# Patient Record
Sex: Male | Born: 1995 | Race: White | Hispanic: No | Marital: Single | Smoking: Never smoker
Health system: Southern US, Community
[De-identification: ages and names within clinical notes are randomized; demographics above are authoritative.]

## PROBLEM LIST (undated history)

## (undated) DIAGNOSIS — IMO0001 Reserved for inherently not codable concepts without codable children: Secondary | ICD-10-CM

## (undated) HISTORY — PX: WISDOM TOOTH EXTRACTION: SHX21

## (undated) HISTORY — DX: Reserved for inherently not codable concepts without codable children: IMO0001

---

## 2014-07-23 ENCOUNTER — Ambulatory Visit (INDEPENDENT_AMBULATORY_CARE_PROVIDER_SITE_OTHER): Payer: BLUE CROSS/BLUE SHIELD

## 2014-07-23 DIAGNOSIS — Z23 Encounter for immunization: Secondary | ICD-10-CM

## 2014-07-23 MED ORDER — HPV QUADRIVALENT VACCINE IM SUSP
0.5000 mL | Freq: Once | INTRAMUSCULAR | Status: AC
  Administered 2014-07-23: 0.5 mL via INTRAMUSCULAR

## 2014-12-08 ENCOUNTER — Encounter: Payer: BLUE CROSS/BLUE SHIELD | Admitting: Family Medicine

## 2014-12-10 ENCOUNTER — Ambulatory Visit (INDEPENDENT_AMBULATORY_CARE_PROVIDER_SITE_OTHER): Payer: BLUE CROSS/BLUE SHIELD | Admitting: Family Medicine

## 2014-12-10 ENCOUNTER — Encounter: Payer: Self-pay | Admitting: Family Medicine

## 2014-12-10 ENCOUNTER — Other Ambulatory Visit: Payer: Self-pay | Admitting: Family Medicine

## 2014-12-10 VITALS — BP 101/62 | HR 62 | Temp 97.0°F | Ht 70.25 in | Wt 131.6 lb

## 2014-12-10 DIAGNOSIS — R591 Generalized enlarged lymph nodes: Secondary | ICD-10-CM | POA: Insufficient documentation

## 2014-12-10 DIAGNOSIS — R599 Enlarged lymph nodes, unspecified: Secondary | ICD-10-CM | POA: Diagnosis not present

## 2014-12-10 DIAGNOSIS — R55 Syncope and collapse: Secondary | ICD-10-CM | POA: Diagnosis not present

## 2014-12-10 DIAGNOSIS — Z0000 Encounter for general adult medical examination without abnormal findings: Secondary | ICD-10-CM

## 2014-12-10 NOTE — Assessment & Plan Note (Signed)
Swollen. Will check labs and await results. Will pursue ultrasound over his winter break to r/o anything concerning given his 10lb weight loss over the last year.

## 2014-12-10 NOTE — Progress Notes (Signed)
BP 101/62 mmHg  Pulse 62  Temp(Src) 97 F (36.1 C)  Ht 5' 10.25" (1.784 m)  Wt 131 lb 9.6 oz (59.693 kg)  BMI 18.76 kg/m2  SpO2 96%   Subjective:    Patient ID: Ryan Bennett, male    DOB: 04/23/1995, 19 y.o.   MRN: [id number]  HPI: Ryan Bennett is a 19 y.o. male presenting on 12/10/2014 for comprehensive medical examination. Current medical complaints include:  Ryan Bennett passed out while having his routine labwork done today with the blood draw. Has a history of vasovagal events. Felt significantly better with laying down and oxygen and drinking some juice.   SKIN LESION Duration: over a year Location: right under the chin on the L side Painful: no Itching: no Onset: gradual Context: not changing Associated signs and symptoms:  History of skin cancer: no History of precancerous skin lesions: no Family history of skin cancer: no   No concerns about STIs, does not want to be screened at this time.   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 Healthy adult     Surgical History:  Past Surgical History  Procedure Laterality Date  . Wisdom tooth extraction      Medications:  No current outpatient prescriptions on file prior to visit.   No current facility-administered medications on file prior to visit.    Allergies:  Allergies  Allergen Reactions  . Cephalosporins Rash  . Penicillins Rash    Social History:  Social History   Social History  . Marital Status: Single    Spouse Name: N/A  . Number of Children: N/A  . Years of Education: N/A   Occupational History  . Student     [employer]- studying mechanical engineering   Social History Main  Topics  . Smoking status: Never Smoker   . Smokeless tobacco: Never Used  . Alcohol Use: No  . Drug Use: No  . Sexual Activity: Not Currently    Birth Control/ Protection: Condom   Other Topics Concern  . Not on file   Social History Narrative   History  Smoking status  . Never Smoker   Smokeless tobacco  . Never Used   History  Alcohol Use No    Family History:  Family History  Problem Relation Age of Onset  . ADD / ADHD Brother     Past medical history, surgical history, medications, allergies, family history and social history reviewed with patient today and changes made to appropriate areas of the chart.   Review of Systems  Constitutional: Positive for weight loss. Negative for fever, chills, malaise/fatigue and diaphoresis.  HENT: Negative.   Eyes: Negative.   Respiratory: Negative.   Cardiovascular: Negative.   Gastrointestinal: Negative.   Genitourinary: Negative.   Musculoskeletal: Negative.   Skin: Negative.   Neurological: Negative.  Negative for weakness.  Endo/Heme/Allergies: Negative.   Psychiatric/Behavioral: Negative.     All other ROS negative except what is listed above and in the HPI.      Objective:    BP 101/62 mmHg  Pulse 62  Temp(Src) 97 F (36.1 C)  Ht 5' 10.25" (1.784 m)  Wt 131 lb 9.6 oz (  59.693 kg)  BMI 18.76 kg/m2  SpO2 96%  Wt Readings from Last 3 Encounters:  12/10/14 131 lb 9.6 oz (59.693 kg)  12/05/14 141 lb (63.957 kg)    Physical Exam  Constitutional: He is oriented to person, place, and time. He appears well-developed and well-nourished. No distress.  HENT:  Head: Normocephalic and atraumatic.  Right Ear: Hearing, tympanic membrane, external ear and ear canal normal.  Left Ear: Hearing, tympanic membrane, external ear and ear canal normal.  Nose: Nose normal.  Mouth/Throat: Uvula is midline, oropharynx is clear and moist and mucous membranes are normal. No oropharyngeal exudate.  Eyes: Conjunctivae, EOM and lids  are normal. Pupils are equal, round, and reactive to light. Right eye exhibits no discharge. Left eye exhibits no discharge. No scleral icterus.  Neck: Normal range of motion. Neck supple. No JVD present. No tracheal deviation present. No thyromegaly present.    1.5cm soft, mobile swelling  Cardiovascular: Normal rate, regular rhythm, normal heart sounds and intact distal pulses.  Exam reveals no gallop and no friction rub.   No murmur heard. Pulmonary/Chest: Effort normal and breath sounds normal. No stridor. No respiratory distress. He has no wheezes. He has no rales. He exhibits no tenderness.  Abdominal: Soft. Bowel sounds are normal. He exhibits no distension and no mass. There is no tenderness. There is no rebound and no guarding.  Genitourinary: Penis normal. No penile tenderness.  Musculoskeletal: Normal range of motion. He exhibits no edema or tenderness.  Lymphadenopathy:    He has cervical adenopathy.  Neurological: He is alert and oriented to person, place, and time. He has normal reflexes. He displays normal reflexes. No cranial nerve deficit. He exhibits normal muscle tone. Coordination normal.  Skin: Skin is warm, dry and intact. No rash noted. He is not diaphoretic. No erythema. There is pallor.  Psychiatric: He has a normal mood and affect. His speech is normal and behavior is normal. Judgment and thought content normal. Cognition and memory are normal.  Nursing note and vitals reviewed.   No results found for this or any previous visit.    Assessment & Plan:   Problem List Items Addressed This Visit      Cardiovascular and Mediastinum   Vasovagal episode    Will eat more often. Will lay down when getting blood drawn. Information given.         Immune and Lymphatic   Lymphadenopathy of head and neck    Swollen. Will check labs and await results. Will pursue ultrasound over his winter break to r/o anything concerning given his 10lb weight loss over the last year.          Other Visit Diagnoses    Routine general medical examination at a health care facility    -  Primary    Up to date on his vaccines. Does not want STI screening. Checking screening labs today. Continue diet and exercise. Continue to monitor.        LABORATORY TESTING:  Health maintenance labs ordered today as discussed above.    IMMUNIZATIONS:   - Tdap: Tetanus vaccination status reviewed: last tetanus booster within 10 years. - Influenza: Given elsewhere  PATIENT COUNSELING:    Sexuality: Discussed sexually transmitted diseases, partner selection, use of condoms, avoidance of unintended pregnancy  and contraceptive alternatives.   Advised to avoid cigarette smoking.  I discussed with the patient that most people either abstain from alcohol or drink within safe limits (<=14/week and <=4 drinks/occasion for males, <=7/weeks  and <= 3 drinks/occasion for females) and that the risk for alcohol disorders and other health effects rises proportionally with the number of drinks per week and how often a drinker exceeds daily limits.  Discussed cessation/primary prevention of drug use and availability of treatment for abuse.   Diet: Encouraged to adjust caloric intake to maintain  or achieve ideal body weight, to reduce intake of dietary saturated fat and total fat, to limit sodium intake by avoiding high sodium foods and not adding table salt, and to maintain adequate dietary potassium and calcium preferably from fresh fruits, vegetables, and low-fat dairy products.    stressed the importance of regular exercise  Injury prevention: Discussed safety belts, safety helmets, smoke detector, smoking near bedding or upholstery.   Dental health: Discussed importance of regular tooth brushing, flossing, and dental visits.   Follow up plan: NEXT PREVENTATIVE PHYSICAL DUE IN 1 YEAR. Return in about 1 year (around 12/10/2015) for Physical, pending results.

## 2014-12-10 NOTE — Assessment & Plan Note (Addendum)
Will eat more often. Will lay down when getting blood drawn. Information given.

## 2014-12-10 NOTE — Patient Instructions (Addendum)
We'll wait for the blood work to come back. If there is anything abnormal, we'll let you know. If your blood work is normal, since the spot has been there for a year, we'll get an ultrasound to make sure it's nothing to worry about over your winter break.  Preventive Care for Adults, Male A healthy lifestyle and preventive care can promote health and wellness. Preventive health guidelines for men include the following key practices:  A routine yearly physical is a good way to check with your health care provider about your health and preventative screening. It is a chance to share any concerns and updates on your health and to receive a thorough exam.  Visit your dentist for a routine exam and preventative care every 6 months. Brush your teeth twice a day and floss once a day. Good oral hygiene prevents tooth decay and gum disease.  The frequency of eye exams is based on your age, health, family medical history, use of contact lenses, and other factors. Follow your health care provider's recommendations for frequency of eye exams.  Eat a healthy diet. Foods such as vegetables, fruits, whole grains, low-fat dairy products, and lean protein foods contain the nutrients you need without too many calories. Decrease your intake of foods high in solid fats, added sugars, and salt. Eat the right amount of calories for you.Get information about a proper diet from your health care provider, if necessary.  Regular physical exercise is one of the most important things you can do for your health. Most adults should get at least 150 minutes of moderate-intensity exercise (any activity that increases your heart rate and causes you to sweat) each week. In addition, most adults need muscle-strengthening exercises on 2 or more days a week.  Maintain a healthy weight. The body mass index (BMI) is a screening tool to identify possible weight problems. It provides an estimate of body fat based on height and weight. Your  health care provider can find your BMI and can help you achieve or maintain a healthy weight.For adults 20 years and older:  A BMI below 18.5 is considered underweight.  A BMI of 18.5 to 24.9 is normal.  A BMI of 25 to 29.9 is considered overweight.  A BMI of 30 and above is considered obese.  Maintain normal blood lipids and cholesterol levels by exercising and minimizing your intake of saturated fat. Eat a balanced diet with plenty of fruit and vegetables. Blood tests for lipids and cholesterol should begin at age 70 and be repeated every 5 years. If your lipid or cholesterol levels are high, you are over 50, or you are at high risk for heart disease, you may need your cholesterol levels checked more frequently.Ongoing high lipid and cholesterol levels should be treated with medicines if diet and exercise are not working.  If you smoke, find out from your health care provider how to quit. If you do not use tobacco, do not start.  Lung cancer screening is recommended for adults aged 44-80 years who are at high risk for developing lung cancer because of a history of smoking. A yearly low-dose CT scan of the lungs is recommended for people who have at least a 30-pack-year history of smoking and are a current smoker or have quit within the past 15 years. A pack year of smoking is smoking an average of 1 pack of cigarettes a day for 1 year (for example: 1 pack a day for 30 years or 2 packs a day  for 15 years). Yearly screening should continue until the smoker has stopped smoking for at least 15 years. Yearly screening should be stopped for people who develop a health problem that would prevent them from having lung cancer treatment.  If you choose to drink alcohol, do not have more than 2 drinks per day. One drink is considered to be 12 ounces (355 mL) of beer, 5 ounces (148 mL) of wine, or 1.5 ounces (44 mL) of liquor.  Avoid use of street drugs. Do not share needles with anyone. Ask for help if  you need support or instructions about stopping the use of drugs.  High blood pressure causes heart disease and increases the risk of stroke. Your blood pressure should be checked at least every 1-2 years. Ongoing high blood pressure should be treated with medicines, if weight loss and exercise are not effective.  If you are 18-62 years old, ask your health care provider if you should take aspirin to prevent heart disease.  Diabetes screening is done by taking a blood sample to check your blood glucose level after you have not eaten for a certain period of time (fasting). If you are not overweight and you do not have risk factors for diabetes, you should be screened once every 3 years starting at age 71. If you are overweight or obese and you are 17-43 years of age, you should be screened for diabetes every year as part of your cardiovascular risk assessment.  Colorectal cancer can be detected and often prevented. Most routine colorectal cancer screening begins at the age of 60 and continues through age 28. However, your health care provider may recommend screening at an earlier age if you have risk factors for colon cancer. On a yearly basis, your health care provider may provide home test kits to check for hidden blood in the stool. Use of a small camera at the end of a tube to directly examine the colon (sigmoidoscopy or colonoscopy) can detect the earliest forms of colorectal cancer. Talk to your health care provider about this at age 23, when routine screening begins. Direct exam of the colon should be repeated every 5-10 years through age 83, unless early forms of precancerous polyps or small growths are found.  People who are at an increased risk for hepatitis B should be screened for this virus. You are considered at high risk for hepatitis B if:  You were born in a country where hepatitis B occurs often. Talk with your health care provider about which countries are considered high risk.  Your  parents were born in a high-risk country and you have not received a shot to protect against hepatitis B (hepatitis B vaccine).  You have HIV or AIDS.  You use needles to inject street drugs.  You live with, or have sex with, someone who has hepatitis B.  You are a man who has sex with other men (MSM).  You get hemodialysis treatment.  You take certain medicines for conditions such as cancer, organ transplantation, and autoimmune conditions.  Hepatitis C blood testing is recommended for all people born from [birth year] through 1965 and any individual with known risks for hepatitis C.  Practice safe sex. Use condoms and avoid high-risk sexual practices to reduce the spread of sexually transmitted infections (STIs). STIs include gonorrhea, chlamydia, syphilis, trichomonas, herpes, HPV, and human immunodeficiency virus (HIV). Herpes, HIV, and HPV are viral illnesses that have no cure. They can result in disability, cancer, and death.  If you  are a man who has sex with other men, you should be screened at least once per year for:  HIV.  Urethral, rectal, and pharyngeal infection of gonorrhea, chlamydia, or both.  If you are at risk of being infected with HIV, it is recommended that you take a prescription medicine daily to prevent HIV infection. This is called preexposure prophylaxis (PrEP). You are considered at risk if:  You are a man who has sex with other men (MSM) and have other risk factors.  You are a heterosexual man, are sexually active, and are at increased risk for HIV infection.  You take drugs by injection.  You are sexually active with a partner who has HIV.  Talk with your health care provider about whether you are at high risk of being infected with HIV. If you choose to begin PrEP, you should first be tested for HIV. You should then be tested every 3 months for as long as you are taking PrEP.  A one-time screening for abdominal aortic aneurysm (AAA) and surgical repair of  large AAAs by ultrasound are recommended for men ages 17 to 39 years who are current or former smokers.  Healthy men should no longer receive prostate-specific antigen (PSA) blood tests as part of routine cancer screening. Talk with your health care provider about prostate cancer screening.  Testicular cancer screening is not recommended for adult males who have no symptoms. Screening includes self-exam, a health care provider exam, and other screening tests. Consult with your health care provider about any symptoms you have or any concerns you have about testicular cancer.  Use sunscreen. Apply sunscreen liberally and repeatedly throughout the day. You should seek shade when your shadow is shorter than you. Protect yourself by wearing long sleeves, pants, a wide-brimmed hat, and sunglasses year round, whenever you are outdoors.  Once a month, do a whole-body skin exam, using a mirror to look at the skin on your back. Tell your health care provider about new moles, moles that have irregular borders, moles that are larger than a pencil eraser, or moles that have changed in shape or color.  Stay current with required vaccines (immunizations).  Influenza vaccine. All adults should be immunized every year.  Tetanus, diphtheria, and acellular pertussis (Td, Tdap) vaccine. An adult who has not previously received Tdap or who does not know his vaccine status should receive 1 dose of Tdap. This initial dose should be followed by tetanus and diphtheria toxoids (Td) booster doses every 10 years. Adults with an unknown or incomplete history of completing a 3-dose immunization series with Td-containing vaccines should begin or complete a primary immunization series including a Tdap dose. Adults should receive a Td booster every 10 years.  Varicella vaccine. An adult without evidence of immunity to varicella should receive 2 doses or a second dose if he has previously received 1 dose.  Human papillomavirus  (HPV) vaccine. Males aged 11-21 years who have not received the vaccine previously should receive the 3-dose series. Males aged 22-26 years may be immunized. Immunization is recommended through the age of 79 years for any male who has sex with males and did not get any or all doses earlier. Immunization is recommended for any person with an immunocompromised condition through the age of 53 years if he did not get any or all doses earlier. During the 3-dose series, the second dose should be obtained 4-8 weeks after the first dose. The third dose should be obtained 24 weeks after the first  dose and 16 weeks after the second dose.  Zoster vaccine. One dose is recommended for adults aged 45 years or older unless certain conditions are present.  Measles, mumps, and rubella (MMR) vaccine. Adults born before [birth year] generally are considered immune to measles and mumps. Adults born in [birth year] or later should have 1 or more doses of MMR vaccine unless there is a contraindication to the vaccine or there is laboratory evidence of immunity to each of the three diseases. A routine second dose of MMR vaccine should be obtained at least 28 days after the first dose for students attending postsecondary schools, health care workers, or international travelers. People who received inactivated measles vaccine or an unknown type of measles vaccine during 1963-1967 should receive 2 doses of MMR vaccine. People who received inactivated mumps vaccine or an unknown type of mumps vaccine before 1979 and are at high risk for mumps infection should consider immunization with 2 doses of MMR vaccine. Unvaccinated health care workers born before [birth year] who lack laboratory evidence of measles, mumps, or rubella immunity or laboratory confirmation of disease should consider measles and mumps immunization with 2 doses of MMR vaccine or rubella immunization with 1 dose of MMR vaccine.  Pneumococcal 13-valent conjugate (PCV13) vaccine. When indicated,  a person who is uncertain of his immunization history and has no record of immunization should receive the PCV13 vaccine. All adults 52 years of age and older should receive this vaccine. An adult aged 60 years or older who has certain medical conditions and has not been previously immunized should receive 1 dose of PCV13 vaccine. This PCV13 should be followed with a dose of pneumococcal polysaccharide (PPSV23) vaccine. Adults who are at high risk for pneumococcal disease should obtain the PPSV23 vaccine at least 8 weeks after the dose of PCV13 vaccine. Adults older than 21 years of age who have normal immune system function should obtain the PPSV23 vaccine dose at least 1 year after the dose of PCV13 vaccine.  Pneumococcal polysaccharide (PPSV23) vaccine. When PCV13 is also indicated, PCV13 should be obtained first. All adults aged 25 years and older should be immunized. An adult younger than age 25 years who has certain medical conditions should be immunized. Any person who resides in a nursing home or long-term care facility should be immunized. An adult smoker should be immunized. People with an immunocompromised condition and certain other conditions should receive both PCV13 and PPSV23 vaccines. People with human immunodeficiency virus (HIV) infection should be immunized as soon as possible after diagnosis. Immunization during chemotherapy or radiation therapy should be avoided. Routine use of PPSV23 vaccine is not recommended for American Indians, [place] Natives, or people younger than 65 years unless there are medical conditions that require PPSV23 vaccine. When indicated, people who have unknown immunization and have no record of immunization should receive PPSV23 vaccine. One-time revaccination 5 years after the first dose of PPSV23 is recommended for people aged 19-64 years who have chronic kidney failure, nephrotic syndrome, asplenia, or immunocompromised conditions. People who received 1-2 doses of  PPSV23 before age 58 years should receive another dose of PPSV23 vaccine at age 32 years or later if at least 5 years have passed since the previous dose. Doses of PPSV23 are not needed for people immunized with PPSV23 at or after age 62 years.  Meningococcal vaccine. Adults with asplenia or persistent complement component deficiencies should receive 2 doses of quadrivalent meningococcal conjugate (MenACWY-D) vaccine. The doses should be obtained at least 2 months apart. Microbiologists  working with certain meningococcal bacteria, [place] recruits, people at risk during an outbreak, and people who travel to or live in countries with a high rate of meningitis should be immunized. A first-year college student up through age 16 years who is living in a residence hall should receive a dose if he did not receive a dose on or after his 16th birthday. Adults who have certain high-risk conditions should receive one or more doses of vaccine.  Hepatitis A vaccine. Adults who wish to be protected from this disease, have chronic liver disease, work with hepatitis A-infected animals, work in hepatitis A research labs, or travel to or work in countries with a high rate of hepatitis A should be immunized. Adults who were previously unvaccinated and who anticipate close contact with an international adoptee during the first 60 days after arrival in the [place] from a country with a high rate of hepatitis A should be immunized.  Hepatitis B vaccine. Adults should be immunized if they wish to be protected from this disease, are under age 23 years and have diabetes, have chronic liver disease, have had more than one sex partner in the past 6 months, may be exposed to blood or other infectious body fluids, are household contacts or sex partners of hepatitis B positive people, are clients or workers in certain care facilities, or travel to or work in countries with a high rate of hepatitis B.  Haemophilus influenzae type  b (Hib) vaccine. A previously unvaccinated person with asplenia or sickle cell disease or having a scheduled splenectomy should receive 1 dose of Hib vaccine. Regardless of previous immunization, a recipient of a hematopoietic stem cell transplant should receive a 3-dose series 6-12 months after his successful transplant. Hib vaccine is not recommended for adults with HIV infection. Preventive Service / Frequency Ages 29 to 19  Blood pressure check.** / Every 3-5 years.  Lipid and cholesterol check.** / Every 5 years beginning at age 95.  Hepatitis C blood test.** / For any individual with known risks for hepatitis C.  Skin self-exam. / Monthly.  Influenza vaccine. / Every year.  Tetanus, diphtheria, and acellular pertussis (Tdap, Td) vaccine.** / Consult your health care provider. 1 dose of Td every 10 years.  Varicella vaccine.** / Consult your health care provider.  HPV vaccine. / 3 doses over 6 months, if 41 or younger.  Measles, mumps, rubella (MMR) vaccine.** / You need at least 1 dose of MMR if you were born in 1957 or later. You may also need a second dose.  Pneumococcal 13-valent conjugate (PCV13) vaccine.** / Consult your health care provider.  Pneumococcal polysaccharide (PPSV23) vaccine.** / 1 to 2 doses if you smoke cigarettes or if you have certain conditions.  Meningococcal vaccine.** / 1 dose if you are age 33 to 23 years and a Market researcher living in a residence hall, or have one of several medical conditions. You may also need additional booster doses.  Hepatitis A vaccine.** / Consult your health care provider.  Hepatitis B vaccine.** / Consult your health care provider.  Haemophilus influenzae type b (Hib) vaccine.** / Consult your health care provider. Ages 109 to 37  Blood pressure check.** / Every year.  Lipid and cholesterol check.** / Every 5 years beginning at age 21.  Lung cancer screening. / Every year if you are aged 40-80 years and have  a 30-pack-year history of smoking and currently smoke or have quit within the past 15 years. Yearly screening is stopped  once you have quit smoking for at least 15 years or develop a health problem that would prevent you from having lung cancer treatment.  Fecal occult blood test (FOBT) of stool. / Every year beginning at age 77 and continuing until age 75. You may not have to do this test if you get a colonoscopy every 10 years.  Flexible sigmoidoscopy** or colonoscopy.** / Every 5 years for a flexible sigmoidoscopy or every 10 years for a colonoscopy beginning at age 35 and continuing until age 82.  Hepatitis C blood test.** / For all people born from [birth year] through 1965 and any individual with known risks for hepatitis C.  Skin self-exam. / Monthly.  Influenza vaccine. / Every year.  Tetanus, diphtheria, and acellular pertussis (Tdap/Td) vaccine.** / Consult your health care provider. 1 dose of Td every 10 years.  Varicella vaccine.** / Consult your health care provider.  Zoster vaccine.** / 1 dose for adults aged 81 years or older.  Measles, mumps, rubella (MMR) vaccine.** / You need at least 1 dose of MMR if you were born in 1959 or later. You may also need a second dose.  Pneumococcal 13-valent conjugate (PCV13) vaccine.** / Consult your health care provider.  Pneumococcal polysaccharide (PPSV23) vaccine.** / 1 to 2 doses if you smoke cigarettes or if you have certain conditions.  Meningococcal vaccine.** / Consult your health care provider.  Hepatitis A vaccine.** / Consult your health care provider.  Hepatitis B vaccine.** / Consult your health care provider.  Haemophilus influenzae type b (Hib) vaccine.** / Consult your health care provider. Ages 77 and over  Blood pressure check.** / Every year.  Lipid and cholesterol check.**/ Every 5 years beginning at age 58.  Lung cancer screening. / Every year if you are aged 65-80 years and have a 30-pack-year history of smoking and  currently smoke or have quit within the past 15 years. Yearly screening is stopped once you have quit smoking for at least 15 years or develop a health problem that would prevent you from having lung cancer treatment.  Fecal occult blood test (FOBT) of stool. / Every year beginning at age 27 and continuing until age 21. You may not have to do this test if you get a colonoscopy every 10 years.  Flexible sigmoidoscopy** or colonoscopy.** / Every 5 years for a flexible sigmoidoscopy or every 10 years for a colonoscopy beginning at age 66 and continuing until age 62.  Hepatitis C blood test.** / For all people born from [birth year] through 1967 and any individual with known risks for hepatitis C.  Abdominal aortic aneurysm (AAA) screening.** / A one-time screening for ages 22 to 33 years who are current or former smokers.  Skin self-exam. / Monthly.  Influenza vaccine. / Every year.  Tetanus, diphtheria, and acellular pertussis (Tdap/Td) vaccine.** / 1 dose of Td every 10 years.  Varicella vaccine.** / Consult your health care provider.  Zoster vaccine.** / 1 dose for adults aged 23 years or older.  Pneumococcal 13-valent conjugate (PCV13) vaccine.** / 1 dose for all adults aged 61 years and older.  Pneumococcal polysaccharide (PPSV23) vaccine.** / 1 dose for all adults aged 70 years and older.  Meningococcal vaccine.** / Consult your health care provider.  Hepatitis A vaccine.** / Consult your health care provider.  Hepatitis B vaccine.** / Consult your health care provider.  Haemophilus influenzae type b (Hib) vaccine.** / Consult your health care provider. **Family history and personal history of risk and conditions may change your health  care provider's recommendations.   This information is not intended to replace advice given to you by your health care provider. Make sure you discuss any questions you have with your health care provider.   Document Released: 03/01/2001 Document Revised:  01/24/2014 Document Reviewed: 05/31/2010 Elsevier Interactive Patient Education [employer]. Syncope, commonly known as fainting, is a temporary loss of consciousness. It occurs when the blood flow to the brain is reduced. Vasovagal syncope (also called neurocardiogenic syncope) is a fainting spell in which the blood flow to the brain is reduced because of a sudden drop in heart rate and blood pressure. Vasovagal syncope occurs when the brain and the cardiovascular system (blood vessels) do not adequately communicate and respond to each other. This is the most common cause of fainting. It often occurs in response to fear or some other type of emotional or physical stress. The body has a reaction in which the heart starts beating too slowly or the blood vessels expand, reducing blood pressure. This type of fainting spell is generally considered harmless. However, injuries can occur if a person takes a sudden fall during a fainting spell.  CAUSES  Vasovagal syncope occurs when a person's blood pressure and heart rate decrease suddenly, usually in response to a trigger. Many things and situations can trigger an episode. Some of these include:   Pain.   Fear.   The sight of blood or medical procedures, such as blood being drawn from a vein.   Common activities, such as coughing, swallowing, stretching, or going to the bathroom.   Emotional stress.   Prolonged standing, especially in a warm environment.   Lack of sleep or rest.   Prolonged lack of food.   Prolonged lack of fluids.   Recent illness.  The use of certain drugs that affect blood pressure, such as cocaine, alcohol, marijuana, inhalants, and opiates.  SYMPTOMS  Before the fainting episode, you may:   Feel dizzy or light headed.   Become pale.  Sense that you are going to faint.   Feel like the room is spinning.   Have tunnel vision, only seeing directly in front of you.   Feel sick to your stomach  (nauseous).   See spots or slowly lose vision.   Hear ringing in your ears.   Have a headache.   Feel warm and sweaty.   Feel a sensation of pins and needles. During the fainting spell, you will generally be unconscious for no longer than a couple minutes before waking up and returning to normal. If you get up too quickly before your body can recover, you may faint again. Some twitching or jerky movements may occur during the fainting spell.  DIAGNOSIS  Your health care provider will ask about your symptoms, take a medical history, and perform a physical exam. Various tests may be done to rule out other causes of fainting. These may include blood tests and tests to check the heart, such as electrocardiography, echocardiography, and possibly an electrophysiology study. When other causes have been ruled out, a test may be done to check the body's response to changes in position (tilt table test). TREATMENT  Most cases of vasovagal syncope do not require treatment. Your health care provider may recommend ways to avoid fainting triggers and may provide home strategies for preventing fainting. If you must be exposed to a possible trigger, you can drink additional fluids to help reduce your chances of having an episode of vasovagal syncope. If you have warning signs  of an oncoming episode, you can respond by positioning yourself favorably (lying down). If your fainting spells continue, you may be given medicines to prevent fainting. Some medicines may help make you more resistant to repeated episodes of vasovagal syncope. Special exercises or compression stockings may be recommended. In rare cases, the surgical placement of a pacemaker is considered. HOME CARE INSTRUCTIONS   Learn to identify the warning signs of vasovagal syncope.   Sit or lie down at the first warning sign of a fainting spell. If sitting, put your head down between your legs. If you lie down, swing your legs up in the air to  increase blood flow to the brain.   Avoid hot tubs and saunas.  Avoid prolonged standing.  Drink enough fluids to keep your urine clear or pale yellow. Avoid caffeine.  Increase salt in your diet as directed by your health care provider.   If you have to stand for a long time, perform movements such as:   Crossing your legs.   Flexing and stretching your leg muscles.   Squatting.   Moving your legs.   Bending over.   Only take over-the-counter or prescription medicines as directed by your health care provider. Do not suddenly stop any medicines without asking your health care provider first. [place] IF:   Your fainting spells continue or happen more frequently in spite of treatment.   You lose consciousness for more than a couple minutes.  You have fainting spells during or after exercising or after being startled.   You have new symptoms that occur with the fainting spells, such as:   Shortness of breath.  Chest pain.   Irregular heartbeat.   You have episodes of twitching or jerky movements that last longer than a few seconds.  You have episodes of twitching or jerky movements without obvious fainting. SEEK IMMEDIATE MEDICAL CARE IF:   You have injuries or bleeding after a fainting spell.   You have episodes of twitching or jerky movements that last longer than 5 minutes.   You have more than one spell of twitching or jerky movements before returning to consciousness after fainting.   This information is not intended to replace advice given to you by your health care provider. Make sure you discuss any questions you have with your health care provider.   Document Released: 12/21/2011 Document Revised: 05/20/2014 Document Reviewed: 12/21/2011 Elsevier Interactive Patient Education 2016 Elsevier Inc. Lymphadenopathy Lymphadenopathy refers to swollen or enlarged lymph glands, also called lymph nodes. Lymph glands are part of your body's  defense (immune) system, which protects the body from infections, germs, and diseases. Lymph glands are found in many locations in your body, including the neck, underarm, and groin.  Many things can cause lymph glands to become enlarged. When your immune system responds to germs, such as viruses or bacteria, infection-fighting cells and fluid build up. This causes the glands to grow in size. Usually, this is not something to worry about. The swelling and any soreness often go away without treatment. However, swollen lymph glands can also be caused by a number of diseases. Your health care provider may do various tests to help determine the cause. If the cause of your swollen lymph glands cannot be found, it is important to monitor your condition to make sure the swelling goes away. HOME CARE INSTRUCTIONS Watch your condition for any changes. The following actions may help to lessen any discomfort you are feeling:  Get plenty of rest.  Take medicines only as directed by your health care provider. Your health care provider may recommend over-the-counter medicines for pain.  Apply moist heat compresses to the site of swollen lymph nodes as directed by your health care provider. This can help reduce any pain.  Check your lymph nodes daily for any changes.  Keep all follow-up visits as directed by your health care provider. This is important. SEEK MEDICAL CARE IF:  Your lymph nodes are still swollen after 2 weeks.  Your swelling increases or spreads to other areas.  Your lymph nodes are hard, seem fixed to the skin, or are growing rapidly.  Your skin over the lymph nodes is red and inflamed.  You have a fever.  You have chills.  You have fatigue.  You develop a sore throat.  You have abdominal pain.  You have weight loss.  You have night sweats. SEEK IMMEDIATE MEDICAL CARE IF:  You notice fluid leaking from the area of the enlarged lymph node.  You have severe pain in any area of  your body.  You have chest pain.  You have shortness of breath.   This information is not intended to replace advice given to you by your health care provider. Make sure you discuss any questions you have with your health care provider.   Document Released: 10/13/2007 Document Revised: 01/24/2014 Document Reviewed: 08/08/2013 Elsevier Interactive Patient Education [employer].

## 2014-12-11 LAB — COMPREHENSIVE METABOLIC PANEL
A/G RATIO: 2.4 (ref 1.1–2.5)
ALT: 14 IU/L (ref 0–44)
AST: 15 IU/L (ref 0–40)
Albumin: 4.7 g/dL (ref 3.5–5.5)
Alkaline Phosphatase: 63 IU/L (ref 39–117)
BUN/Creatinine Ratio: 12 (ref 8–19)
BUN: 12 mg/dL (ref 6–20)
Bilirubin Total: 0.7 mg/dL (ref 0.0–1.2)
CALCIUM: 9.5 mg/dL (ref 8.7–10.2)
CO2: 24 mmol/L (ref 18–29)
Chloride: 100 mmol/L (ref 97–106)
Creatinine, Ser: 0.97 mg/dL (ref 0.76–1.27)
GFR, EST AFRICAN AMERICAN: 128 mL/min/{1.73_m2} (ref >59)
GFR, EST NON AFRICAN AMERICAN: 111 mL/min/{1.73_m2} (ref >59)
Globulin, Total: 2 g/dL (ref 1.5–4.5)
Glucose: 80 mg/dL (ref 65–99)
POTASSIUM: 4.1 mmol/L (ref 3.5–5.2)
Sodium: 140 mmol/L (ref 136–144)
TOTAL PROTEIN: 6.7 g/dL (ref 6.0–8.5)

## 2014-12-11 LAB — CBC WITH DIFFERENTIAL/PLATELET
Basophils Absolute: 0 10*3/uL (ref 0.0–0.2)
Basos: 0 %
EOS (ABSOLUTE): 0.1 10*3/uL (ref 0.0–0.4)
Eos: 2 %
Hematocrit: 40.9 % (ref 37.5–51.0)
Hemoglobin: 14.3 g/dL (ref 12.6–17.7)
IMMATURE GRANS (ABS): 0 10*3/uL (ref 0.0–0.1)
Immature Granulocytes: 0 %
LYMPHS: 45 %
Lymphocytes Absolute: 2.5 10*3/uL (ref 0.7–3.1)
MCH: 29.7 pg (ref 26.6–33.0)
MCHC: 35 g/dL (ref 31.5–35.7)
MCV: 85 fL (ref 79–97)
MONOCYTES: 10 %
Monocytes Absolute: 0.6 10*3/uL (ref 0.1–0.9)
NEUTROS ABS: 2.5 10*3/uL (ref 1.4–7.0)
Neutrophils: 43 %
Platelets: 207 10*3/uL (ref 150–379)
RBC: 4.81 x10E6/uL (ref 4.14–5.80)
RDW: 12.4 % (ref 12.3–15.4)
WBC: 5.8 10*3/uL (ref 3.4–10.8)

## 2014-12-11 LAB — LIPID PANEL W/O CHOL/HDL RATIO
Cholesterol, Total: 139 mg/dL (ref 100–199)
HDL: 49 mg/dL (ref >39)
LDL Calculated: 79 mg/dL (ref 0–99)
Triglycerides: 57 mg/dL (ref 0–149)
VLDL Cholesterol Cal: 11 mg/dL (ref 5–40)

## 2014-12-11 LAB — TSH: TSH: 2.73 u[IU]/mL (ref 0.450–4.500)

## 2014-12-15 ENCOUNTER — Encounter: Payer: Self-pay | Admitting: Family Medicine

## 2014-12-15 ENCOUNTER — Telehealth: Payer: Self-pay | Admitting: Family Medicine

## 2014-12-15 NOTE — Telephone Encounter (Signed)
Patient notified

## 2014-12-15 NOTE — Telephone Encounter (Signed)
Please let him know that his labs came back nice and normal!

## 2016-01-01 ENCOUNTER — Ambulatory Visit (INDEPENDENT_AMBULATORY_CARE_PROVIDER_SITE_OTHER): Payer: BLUE CROSS/BLUE SHIELD

## 2016-01-01 DIAGNOSIS — Z23 Encounter for immunization: Secondary | ICD-10-CM | POA: Diagnosis not present

## 2016-07-22 DIAGNOSIS — L700 Acne vulgaris: Secondary | ICD-10-CM | POA: Diagnosis not present

## 2016-07-22 DIAGNOSIS — D485 Neoplasm of uncertain behavior of skin: Secondary | ICD-10-CM | POA: Diagnosis not present

## 2016-08-12 ENCOUNTER — Telehealth: Payer: Self-pay | Admitting: Family Medicine

## 2016-08-12 NOTE — Telephone Encounter (Signed)
I have not seen Ryan Bennett since 2016. If it has not gone away/gotten worse in 2 years, it should be seen, however, it depends on what it is where I send him, so I really need to see him.

## 2016-08-12 NOTE — Telephone Encounter (Signed)
Called and left a message for patient to call and schedule an appointment.

## 2016-08-12 NOTE — Telephone Encounter (Signed)
Patients father called in regards to the knot on his son gland side of neck.  He states Dr Laural BenesJohnson is aware of the knot and felt it was nothing to be concerned about at that time but it seems to have gotten larger and he is wanting a referral to someone for the patient.  He states he is willing to schedule an appt to see Dr Laural BenesJohnson but if at all possible would prefer to skip this step and be sent directly to the specialist for this.   Thanks

## 2016-08-12 NOTE — Telephone Encounter (Signed)
Forwarding to provider.

## 2016-08-15 NOTE — Telephone Encounter (Signed)
Called and left a message for patient to call and schedule an appointment.

## 2016-08-16 ENCOUNTER — Ambulatory Visit (INDEPENDENT_AMBULATORY_CARE_PROVIDER_SITE_OTHER): Payer: BLUE CROSS/BLUE SHIELD | Admitting: Family Medicine

## 2016-08-16 ENCOUNTER — Ambulatory Visit: Payer: Self-pay

## 2016-08-16 ENCOUNTER — Encounter: Payer: Self-pay | Admitting: Family Medicine

## 2016-08-16 VITALS — BP 108/69 | HR 67 | Temp 97.7°F | Ht 71.5 in | Wt 131.0 lb

## 2016-08-16 DIAGNOSIS — R591 Generalized enlarged lymph nodes: Secondary | ICD-10-CM | POA: Diagnosis not present

## 2016-08-16 NOTE — Telephone Encounter (Signed)
Patient scheduled for an appointment.  

## 2016-08-16 NOTE — Progress Notes (Signed)
BP 108/69 (BP Location: Left Arm, Patient Position: Sitting, Cuff Size: Normal)   Pulse 67   Temp 97.7 F (36.5 C)   Ht 5' 11.5" (1.816 m)   Wt 131 lb (59.4 kg)   SpO2 99%   BMI 18.02 kg/m    Subjective:    Patient ID: Ryan HoitJack Abruzzese, male    DOB: 01/12/1996, 21 y.o.   MRN: [id number]  HPI: Ryan Bennett is a 21 y.o. male  Chief Complaint  Patient presents with  . Mass   LUMP Duration: About 3 years Location: Right under the chin on the L side Onset: gradual Painful: no Discomfort: no Status:  bigger Trauma: no Redness: no Bruising: no Recent infection: no Swollen lymph nodes: unsure Requesting removal: yes History of cancer: no Family history of cancer: no History of the same: no Associated signs and symptoms: none, no weight loss, no night sweats, feeling well, Dad is just concerned about it  Relevant past medical, surgical, family and social history reviewed and updated as indicated. Interim medical history since our last visit reviewed. Allergies and medications reviewed and updated.  Review of Systems  Constitutional: Negative.   HENT: Negative for congestion, dental problem, drooling, ear discharge, ear pain, facial swelling, hearing loss, mouth sores, nosebleeds, postnasal drip, rhinorrhea, sinus pain, sinus pressure, sneezing, sore throat, tinnitus, trouble swallowing and voice change.   Respiratory: Negative.   Cardiovascular: Negative.   Musculoskeletal: Negative.   Psychiatric/Behavioral: Negative.     Per HPI unless specifically indicated above     Objective:    BP 108/69 (BP Location: Left Arm, Patient Position: Sitting, Cuff Size: Normal)   Pulse 67   Temp 97.7 F (36.5 C)   Ht 5' 11.5" (1.816 m)   Wt 131 lb (59.4 kg)   SpO2 99%   BMI 18.02 kg/m   Wt Readings from Last 3 Encounters:  08/16/16 131 lb (59.4 kg)  12/10/14 131 lb 9.6 oz (59.7 kg)  12/05/14 141 lb (64 kg)    Physical Exam  Constitutional: He is oriented to person, place, and  time. He appears well-developed and well-nourished. No distress.  HENT:  Head: Normocephalic and atraumatic.  Right Ear: Hearing normal.  Left Ear: Hearing normal.  Nose: Nose normal.  Eyes: Conjunctivae and lids are normal. Right eye exhibits no discharge. Left eye exhibits no discharge. No scleral icterus.  Neck:    Mobile, soft, 2cm lump anterior neck on L side, just below mandible   Cardiovascular: Normal rate, regular rhythm, normal heart sounds and intact distal pulses.  Exam reveals no gallop and no friction rub.   No murmur heard. Pulmonary/Chest: Effort normal and breath sounds normal. No respiratory distress. He has no wheezes. He has no rales. He exhibits no tenderness.  Musculoskeletal: Normal range of motion.  Neurological: He is alert and oriented to person, place, and time.  Skin: Skin is warm, dry and intact. No rash noted. He is not diaphoretic. No erythema. No pallor.  Psychiatric: He has a normal mood and affect. His speech is normal and behavior is normal. Judgment and thought content normal. Cognition and memory are normal.  Nursing note and vitals reviewed.   Results for orders placed or performed in visit on 12/10/14  TSH  Result Value Ref Range   TSH 2.730 0.450 - 4.500 uIU/mL  Lipid Panel w/o Chol/HDL Ratio  Result Value Ref Range   Cholesterol, Total 139 100 - 199 mg/dL   Triglycerides 57 0 - 149 mg/dL  HDL 49 >39 mg/dL   VLDL Cholesterol Cal 11 5 - 40 mg/dL   LDL Calculated 79 0 - 99 mg/dL  Comprehensive metabolic panel  Result Value Ref Range   Glucose 80 65 - 99 mg/dL   BUN 12 6 - 20 mg/dL   Creatinine, Ser 1.610.97 0.76 - 1.27 mg/dL   GFR calc non Af Amer 111 >59 mL/min/1.73   GFR calc Af Amer 128 >59 mL/min/1.73   BUN/Creatinine Ratio 12 8 - 19   Sodium 140 136 - 144 mmol/L   Potassium 4.1 3.5 - 5.2 mmol/L   Chloride 100 97 - 106 mmol/L   CO2 24 18 - 29 mmol/L   Calcium 9.5 8.7 - 10.2 mg/dL   Total Protein 6.7 6.0 - 8.5 g/dL   Albumin 4.7 3.5  - 5.5 g/dL   Globulin, Total 2.0 1.5 - 4.5 g/dL   Albumin/Globulin Ratio 2.4 1.1 - 2.5   Bilirubin Total 0.7 0.0 - 1.2 mg/dL   Alkaline Phosphatase 63 39 - 117 IU/L   AST 15 0 - 40 IU/L   ALT 14 0 - 44 IU/L  CBC with Differential/Platelet  Result Value Ref Range   WBC 5.8 3.4 - 10.8 x10E3/uL   RBC 4.81 4.14 - 5.80 x10E6/uL   Hemoglobin 14.3 12.6 - 17.7 g/dL   Hematocrit 09.640.9 04.537.5 - 51.0 %   MCV 85 79 - 97 fL   MCH 29.7 26.6 - 33.0 pg   MCHC 35.0 31.5 - 35.7 g/dL   RDW 40.912.4 81.112.3 - 91.415.4 %   Platelets 207 150 - 379 x10E3/uL   Neutrophils 43 %   Lymphs 45 %   Monocytes 10 %   Eos 2 %   Basos 0 %   Neutrophils Absolute 2.5 1.4 - 7.0 x10E3/uL   Lymphocytes Absolute 2.5 0.7 - 3.1 x10E3/uL   Monocytes Absolute 0.6 0.1 - 0.9 x10E3/uL   EOS (ABSOLUTE) 0.1 0.0 - 0.4 x10E3/uL   Basophils Absolute 0.0 0.0 - 0.2 x10E3/uL   Immature Granulocytes 0 %   Immature Grans (Abs) 0.0 0.0 - 0.1 x10E3/uL      Assessment & Plan:   Problem List Items Addressed This Visit      Immune and Lymphatic   Lymphadenopathy of head and neck - Primary    CBC normal. Did not go for US last time he was here, will get scheduled today. Will get him into general surgery for evaluation and possible removal. Appointment scheduled today. Continue to monitor. Call with any concerns.       Relevant Orders   CBC With Differential/Platelet   US Soft Tissue Head/Neck   Ambulatory referral to General Surgery       Follow up plan: Return Pending results, otherwise November for physical.

## 2016-08-16 NOTE — Assessment & Plan Note (Signed)
CBC normal. Did not go for US last time he was here, will get scheduled today. Will get him into general surgery for evaluation and possible removal. Appointment scheduled today. Continue to monitor. Call with any concerns.

## 2016-08-16 NOTE — Patient Instructions (Addendum)
Ultrasound of your neck 4PM today (08/16/16) [facility] ([facility] the [employer] [place])  Appointment with General Surgery Friday 08/19/16 10:15 AM [address], [place], [place] [address]  [phone]

## 2016-08-17 ENCOUNTER — Telehealth: Payer: Self-pay

## 2016-08-17 ENCOUNTER — Ambulatory Visit
Admission: RE | Admit: 2016-08-17 | Discharge: 2016-08-17 | Disposition: A | Discharge status: Home / Self Care | Payer: BLUE CROSS/BLUE SHIELD | Source: Ambulatory Visit | Attending: Family Medicine | Admitting: Family Medicine

## 2016-08-17 DIAGNOSIS — R591 Generalized enlarged lymph nodes: Secondary | ICD-10-CM | POA: Diagnosis not present

## 2016-08-17 DIAGNOSIS — R221 Localized swelling, mass and lump, neck: Secondary | ICD-10-CM | POA: Diagnosis not present

## 2016-08-17 NOTE — Telephone Encounter (Signed)
Ann from ultrasound called to relay results from today's ultrasound. Please review.   They are in system.

## 2016-08-17 NOTE — Telephone Encounter (Signed)
Left message on cell phone nothing concerning on ultrasound will give more details tomorrow.

## 2016-08-17 NOTE — Telephone Encounter (Signed)
Message relayed to patient. Verbalized understanding and denied questions.   

## 2016-08-19 ENCOUNTER — Ambulatory Visit (INDEPENDENT_AMBULATORY_CARE_PROVIDER_SITE_OTHER): Payer: BLUE CROSS/BLUE SHIELD | Admitting: Surgery

## 2016-08-19 ENCOUNTER — Encounter: Payer: Self-pay | Admitting: Surgery

## 2016-08-19 VITALS — BP 107/65 | HR 64 | Temp 98.2°F | Ht 71.5 in | Wt 135.8 lb

## 2016-08-19 DIAGNOSIS — R590 Localized enlarged lymph nodes: Secondary | ICD-10-CM | POA: Diagnosis not present

## 2016-08-19 NOTE — Patient Instructions (Signed)
We have sent in a referral for ENT  They will contact you with an appointment.

## 2016-08-19 NOTE — Progress Notes (Signed)
Pamelia HoitJack Marsico is an 21 y.o. male.   Chief Complaint: Neck mass HPI: This patient with likely cervical adenopathy. He has had an ultrasound. He has had a left neck mass for 6-12 months. He thinks over the last 6 months that is increasing in size. It causes him no pain and he has no fevers chills night sweats or weight loss. There is no family history of lymphomas or leukemias. His father has prostate cancer. He has had no other evaluations of this. No nodes in any other location.  Past Medical History:  Diagnosis Date  . Healthy adult     Past Surgical History:  Procedure Laterality Date  . WISDOM TOOTH EXTRACTION      Family History  Problem Relation Age of Onset  . ADD / ADHD Brother    Social History:  reports that he has never smoked. He has never used smokeless tobacco. He reports that he does not drink alcohol or use drugs.  Allergies:  Allergies  Allergen Reactions  . Cephalosporins Rash  . Penicillins Rash     (Not in a hospital admission)   Review of Systems:   Review of Systems  Constitutional: Negative for chills, diaphoresis, fever, malaise/fatigue and weight loss.  HENT: Negative.   Eyes: Negative.   Respiratory: Negative.   Cardiovascular: Negative.   Gastrointestinal: Negative.   Genitourinary: Negative.   Musculoskeletal: Negative.   Skin: Negative.   Neurological: Negative.   Endo/Heme/Allergies: Negative.   Psychiatric/Behavioral: Negative.     Physical Exam:  Physical Exam  Constitutional: He is oriented to person, place, and time and well-developed, well-nourished, and in no distress. No distress.  Thin male patient no acute distress  HENT:  Head: Normocephalic and atraumatic.  Eyes: Pupils are equal, round, and reactive to light. Right eye exhibits no discharge. Left eye exhibits no discharge. No scleral icterus.  Neck: Normal range of motion. Neck supple. No JVD present. No tracheal deviation present. No thyromegaly present.  Single  submandibular node on the left side. Measures approximately 12 mm by palpation. It is soft.  Cardiovascular: Normal rate, regular rhythm and normal heart sounds.   Pulmonary/Chest: Effort normal and breath sounds normal. No stridor. No respiratory distress. He has no wheezes.  Abdominal: Soft. He exhibits no distension.  Musculoskeletal: Normal range of motion. He exhibits no edema.  Lymphadenopathy:    He has cervical adenopathy.  Neurological: He is alert and oriented to person, place, and time.  Skin: Skin is warm and dry. No rash noted. He is not diaphoretic. No erythema.  Psychiatric: Mood and affect normal.  Vitals reviewed.   There were no vitals taken for this visit.    No results found for this or any previous visit (from the past 48 hour(s)). [place] Soft Tissue Head/neck  Result Date: 08/17/2016 CLINICAL DATA:  21 year old male with a six-month history of chronic left submental lymphadenopathy EXAM: ULTRASOUND OF HEAD/NECK SOFT TISSUES TECHNIQUE: Ultrasound examination of the head and neck soft tissues was performed in the area of clinical concern. COMPARISON:  None. FINDINGS: Sonographic interrogation of the palpable abnormality demonstrates several prominent submandibular/submental lymph nodes measuring 0.5 and 0.7 cm in short axis. Sonographic interrogation of the contralateral right submandibular space demonstrates a similar appearing lymph node which is 1.0 cm in short axis. IMPRESSION: Sonographic interrogation of the palpable abnormalities demonstrates prominence submandibular/submental lymphadenopathy. Given the patient's age, these are highly likely to be reactive in nature. Electronically Signed   By: Malachy MoanHeath  McCullough M.D.   On:  08/17/2016 15:26     Assessment/Plan Solitary left submandibular lymph node.  ltrasound measures this at 7 mm and on the right side is a 10 millimeter node as well and the similar position. At right-sided node is not palpable by me.  This node is  been there for nearly a year and is remaining a fairly normal size but the patient is very thin and therefore it is palpable and visible. Patient is a Consulting civil engineerstudent at [employer] and has likely been exposed to multiple viruses over the past year. Father is very concerned about this. He has had prostate cancer and is concerned that this note is abnormal. I pointed out to both patient and her father the low risk circumstances here but that the risk is never 0. I offered his CT scan. I also offered a second opinion from an ENT physician.  Father is pushing for an open biopsy or a needle biopsy. I discussed with them again the rationale for not offering surgery as a first choice in this low risk individual.  I have suggested ENT consultation as a second opinion. I could easily be arranged and hopefully aswage any fears that may be present here I will arrange for ENT consultation. Lattie Hawichard E Nikitta Sobiech, MD, FACS

## 2016-08-22 ENCOUNTER — Telehealth: Payer: Self-pay | Admitting: Surgery

## 2016-08-22 LAB — CBC WITH DIFFERENTIAL/PLATELET
HEMATOCRIT: 43.5 % (ref 37.5–51.0)
Hemoglobin: 14.6 g/dL (ref 13.0–17.7)
LYMPHS: 51 %
Lymphocytes Absolute: 2.7 10*3/uL (ref 0.7–3.1)
MCH: 29.6 pg (ref 26.6–33.0)
MCHC: 33.6 g/dL (ref 31.5–35.7)
MCV: 88 fL (ref 79–97)
MID (ABSOLUTE): 0.5 10*3/uL (ref 0.1–1.6)
MID: 9 %
Neutrophils Absolute: 2 10*3/uL (ref 1.4–7.0)
Neutrophils: 40 %
Platelets: 164 10*3/uL (ref 150–379)
RBC: 4.94 x10E6/uL (ref 4.14–5.80)
RDW: 12.9 % (ref 12.3–15.4)
WBC: 5.2 10*3/uL (ref 3.4–10.8)

## 2016-08-22 NOTE — Telephone Encounter (Signed)
I have faxed a referral to Dr Mikey BussingMcQueen's office per Dr Earmon Phoenixooper's request for patient to be consulted for enlarged lipoma/lymphnode on left side of neck.   Clinic notes and demographics faxed to [phone].  I will follow up to make sure appointment is made in a timely manner.

## 2016-08-24 NOTE — Telephone Encounter (Signed)
Appointment has been made and patient is aware.   September 08, 2016 @ 2:45pm--Dr Willeen CassBennett.   [facility].

## 2016-08-25 ENCOUNTER — Telehealth: Payer: Self-pay | Admitting: General Practice

## 2016-08-25 NOTE — Telephone Encounter (Signed)
error 

## 2016-08-26 ENCOUNTER — Encounter: Payer: Self-pay | Admitting: Family Medicine

## 2016-08-26 ENCOUNTER — Ambulatory Visit (INDEPENDENT_AMBULATORY_CARE_PROVIDER_SITE_OTHER): Payer: BLUE CROSS/BLUE SHIELD | Admitting: Family Medicine

## 2016-08-26 VITALS — BP 106/68 | HR 87 | Temp 98.6°F | Wt 133.0 lb

## 2016-08-26 DIAGNOSIS — J029 Acute pharyngitis, unspecified: Secondary | ICD-10-CM

## 2016-08-26 MED ORDER — NYSTATIN 100000 UNIT/ML MT SUSP: 5.0000 mL | Freq: Four times a day (QID) | OROMUCOSAL | 0 refills | Status: AC

## 2016-08-26 NOTE — Progress Notes (Signed)
   BP 106/68   Pulse 87   Temp 98.6 F (37 C)   Wt 133 lb (60.3 kg)   SpO2 97%   BMI 18.29 kg/m    Subjective:    Patient ID: Ryan Bennett, male    DOB: Jul 23, 1995, 21 y.o.   MRN: [id number]  HPI: Ryan HoitJack Mccubbin is a 21 y.o. male  Chief Complaint  Patient presents with  . Sore Throat    x 5 days, with head congestion. No fever, no sinus drainage, no cough.   Patient presents with 5 day hx of sore throat. Denies congestion, HA, ear pain, fever, chills, aches. No sick contacts. Not trying anything OTC for sxs. Of note, still having persistent benign left cervical lymphadenopathy as evidenced by recent u/s. No change in size, tenderness, or erythema in the area.   Relevant past medical, surgical, family and social history reviewed and updated as indicated. Interim medical history since our last visit reviewed. Allergies and medications reviewed and updated.  Review of Systems  Constitutional: Negative.   HENT: Positive for sore throat.   Respiratory: Negative.   Cardiovascular: Negative.   Gastrointestinal: Negative.   Musculoskeletal: Negative.   Neurological: Negative.   Psychiatric/Behavioral: Negative.    Per HPI unless specifically indicated above     Objective:    BP 106/68   Pulse 87   Temp 98.6 F (37 C)   Wt 133 lb (60.3 kg)   SpO2 97%   BMI 18.29 kg/m   Wt Readings from Last 3 Encounters:  08/26/16 133 lb (60.3 kg)  08/19/16 135 lb 12.8 oz (61.6 kg)  08/16/16 131 lb (59.4 kg)    Physical Exam  Constitutional: He is oriented to person, place, and time. He appears well-developed and well-nourished. No distress.  HENT:  Head: Atraumatic.  Right Ear: External ear normal.  Left Ear: External ear normal.  Nose: Nose normal.  Oropharynx and tongue erythematous with white coating   Eyes: Pupils are equal, round, and reactive to light. Conjunctivae are normal. No scleral icterus.  Neck: Normal range of motion. Neck supple.  Cardiovascular: Normal rate, regular  rhythm and normal heart sounds.   Pulmonary/Chest: Effort normal and breath sounds normal.  Musculoskeletal: Normal range of motion.  Lymphadenopathy:    He has cervical adenopathy (Left, firm, focal).  Neurological: He is alert and oriented to person, place, and time.  Skin: Skin is warm and dry.  Psychiatric: He has a normal mood and affect. His behavior is normal.  Nursing note and vitals reviewed.     Assessment & Plan:   Problem List Items Addressed This Visit    None    Visit Diagnoses    Sore throat    -  Primary   Rapid strep neg. Suspect thrush given physical exam. Will start nystatin gargle, probiotic. F/u if no improvement   Relevant Orders   Rapid st[facility] screen ([facility]) (Completed)       Follow up plan: Return if symptoms worsen or fail to improve.

## 2016-08-29 LAB — CULTURE, GROUP A STREP: STREP A CULTURE: NEGATIVE

## 2016-08-29 LAB — RAPID STREP SCREEN (MED CTR MEBANE ONLY): Strep Gp A Ag, IA W/Reflex: NEGATIVE

## 2016-08-29 NOTE — Patient Instructions (Signed)
Follow up as needed

## 2016-12-06 ENCOUNTER — Encounter: Payer: BLUE CROSS/BLUE SHIELD | Admitting: Family Medicine

## 2019-05-18 DIAGNOSIS — R110 Nausea: Secondary | ICD-10-CM | POA: Diagnosis not present

## 2019-05-18 DIAGNOSIS — R404 Transient alteration of awareness: Secondary | ICD-10-CM | POA: Diagnosis not present

## 2019-05-18 DIAGNOSIS — R031 Nonspecific low blood-pressure reading: Secondary | ICD-10-CM | POA: Diagnosis not present

## 2019-05-18 DIAGNOSIS — F10929 Alcohol use, unspecified with intoxication, unspecified: Secondary | ICD-10-CM | POA: Diagnosis not present

## 2019-05-18 DIAGNOSIS — R1111 Vomiting without nausea: Secondary | ICD-10-CM | POA: Diagnosis not present

## 2019-05-18 DIAGNOSIS — R410 Disorientation, unspecified: Secondary | ICD-10-CM | POA: Diagnosis not present

## 2019-05-18 DIAGNOSIS — F10120 Alcohol abuse with intoxication, uncomplicated: Secondary | ICD-10-CM | POA: Diagnosis not present

## 2019-05-18 DIAGNOSIS — F10129 Alcohol abuse with intoxication, unspecified: Secondary | ICD-10-CM | POA: Diagnosis not present

## 2019-07-03 IMAGING — US US SOFT TISSUE HEAD/NECK
1 series · 14 of 20 positions shown · non-contrast
Comparison: None.

CLINICAL DATA: 21-year-old male with a six-month history of chronic
left submental lymphadenopathy

EXAM:
ULTRASOUND OF HEAD/NECK SOFT TISSUES
TECHNIQUE: Ultrasound examination of the head and neck soft tissues was
performed in the area of clinical concern.

[Series 1: us soft tissue head/neck · 0.04mm/px · 14 of 20 slices shown]
[im 1/20]
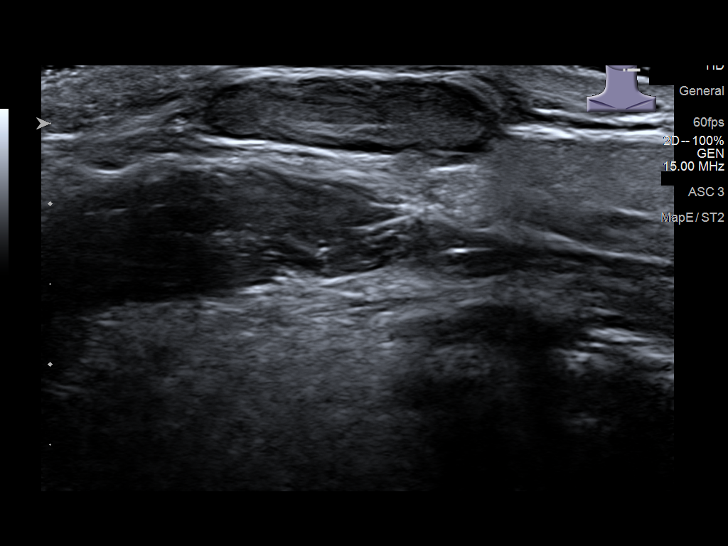
[im 3/20]
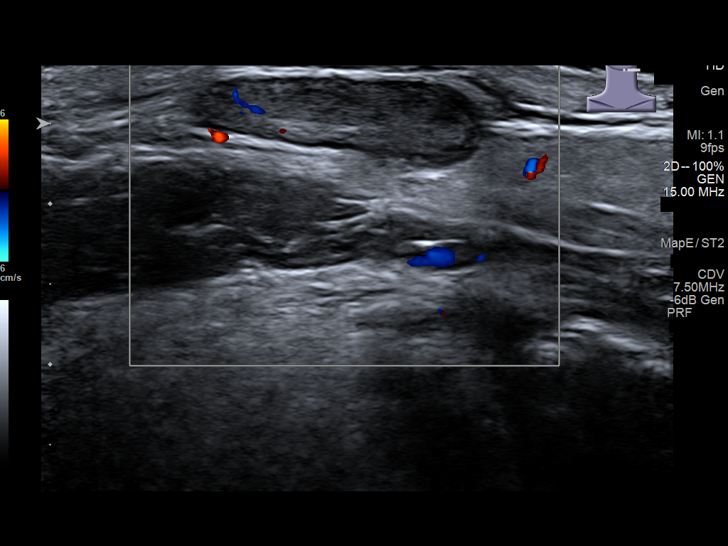
[im 4/20]
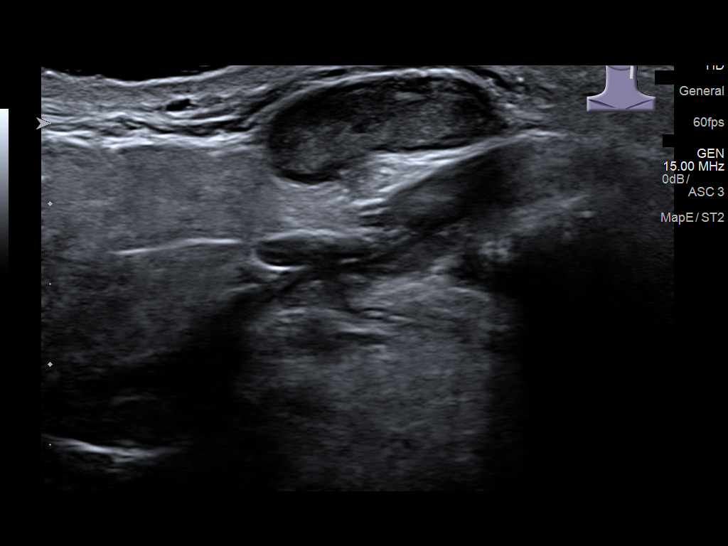
[im 6/20]
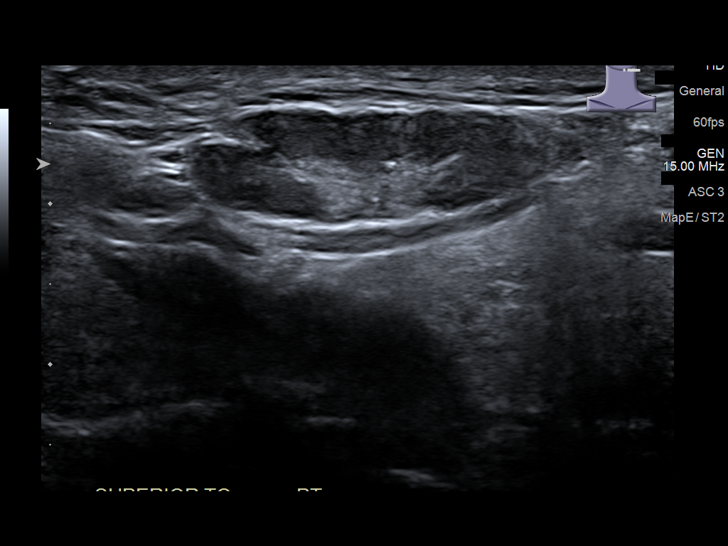
[im 7/20]
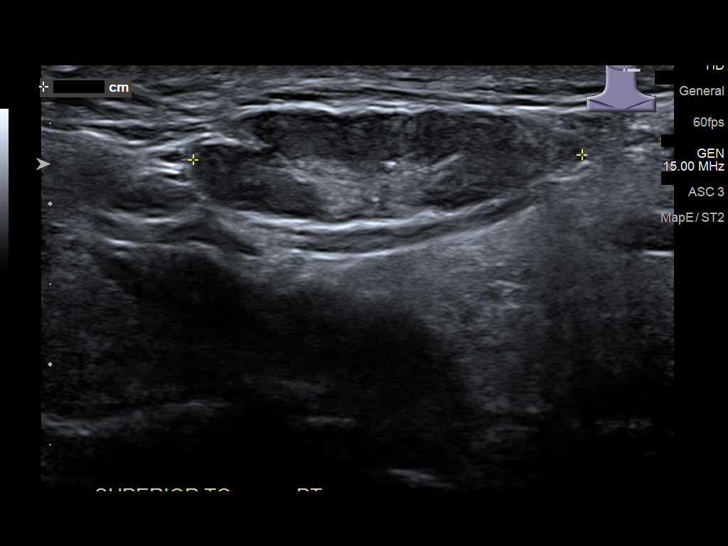
[im 8/20]
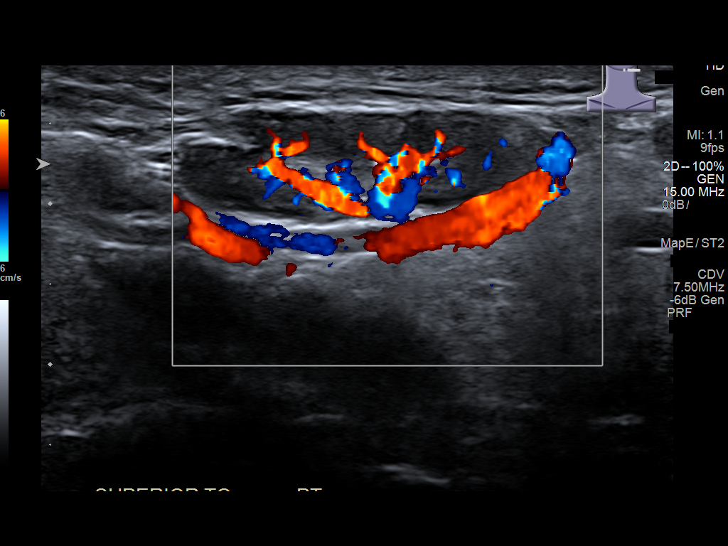
[im 10/20]
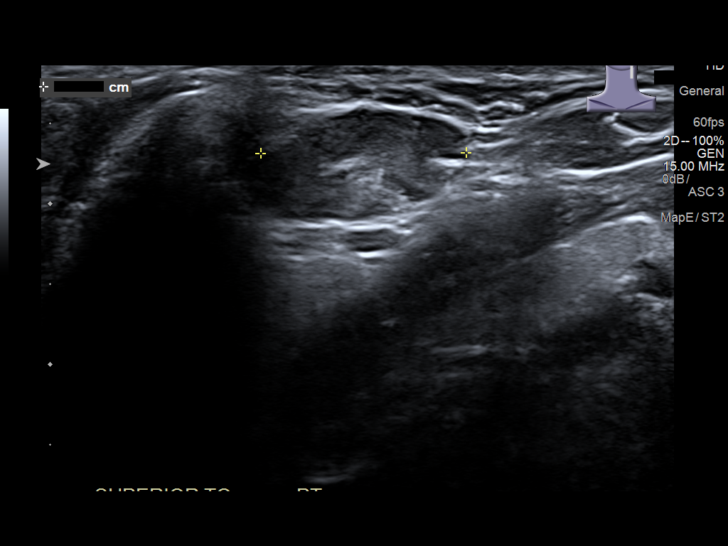
[im 11/20]
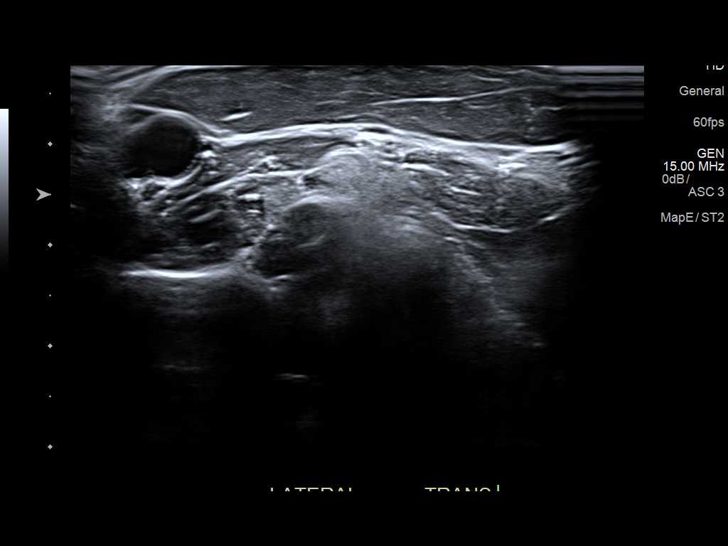
[im 13/20]
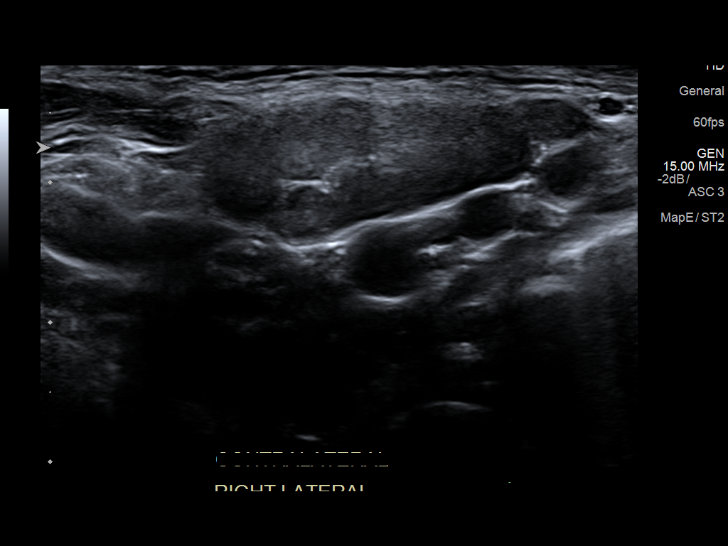
[im 14/20]
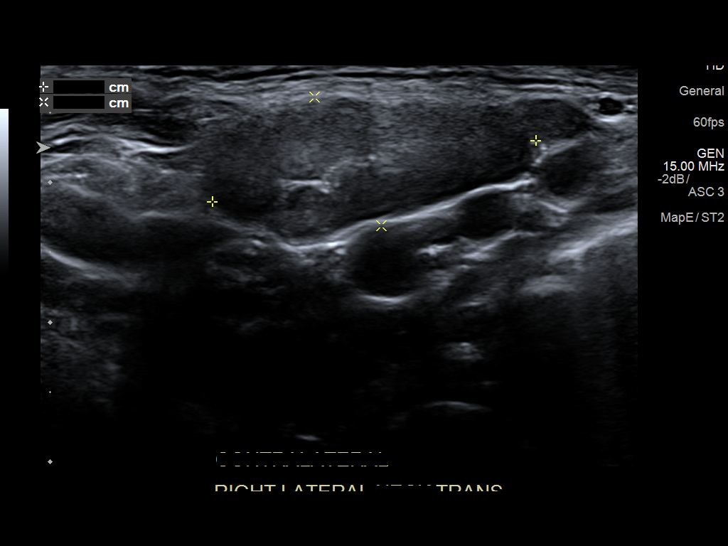
[im 16/20]
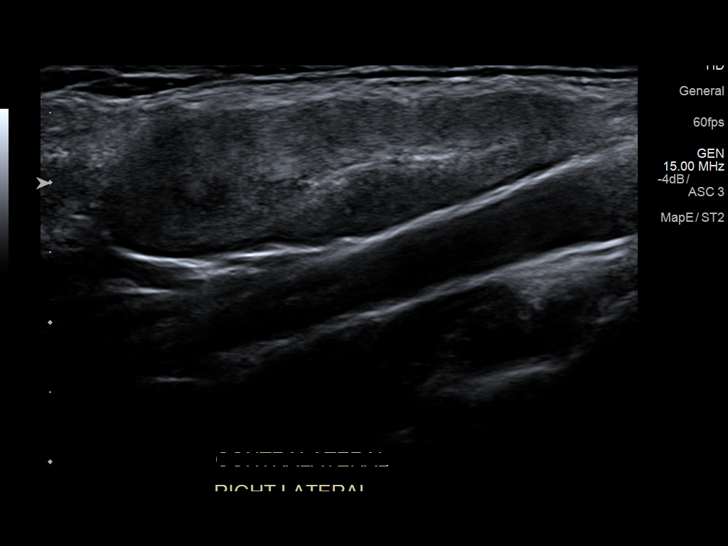
[im 17/20]
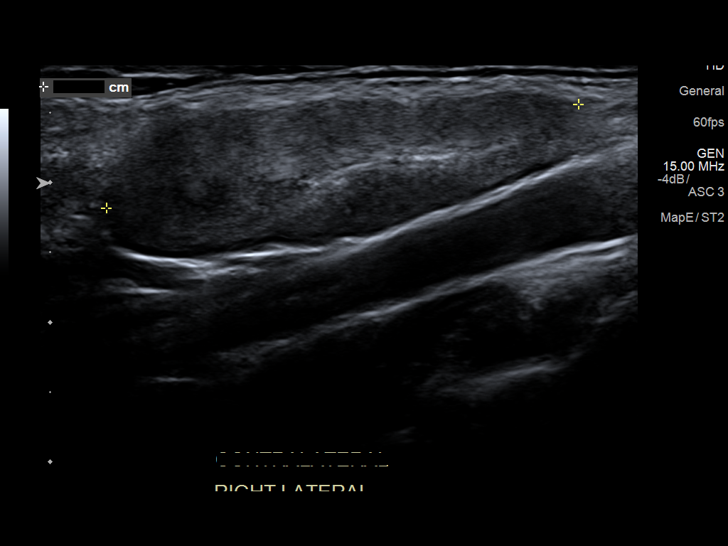
[im 18/20]
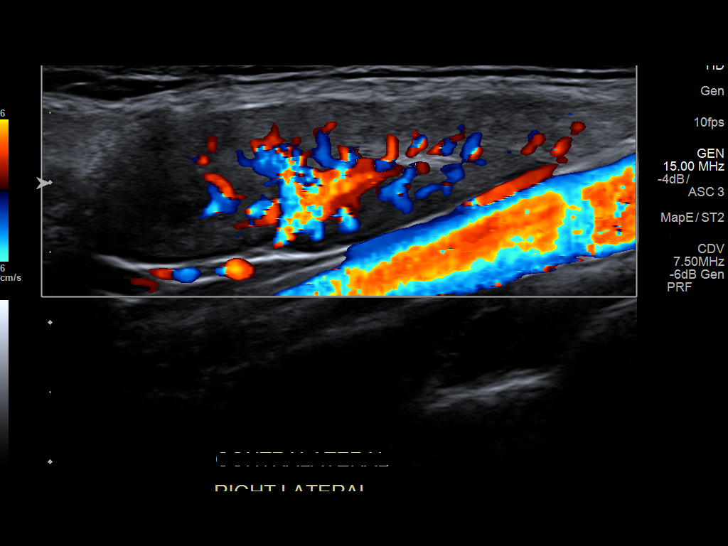
[im 20/20]
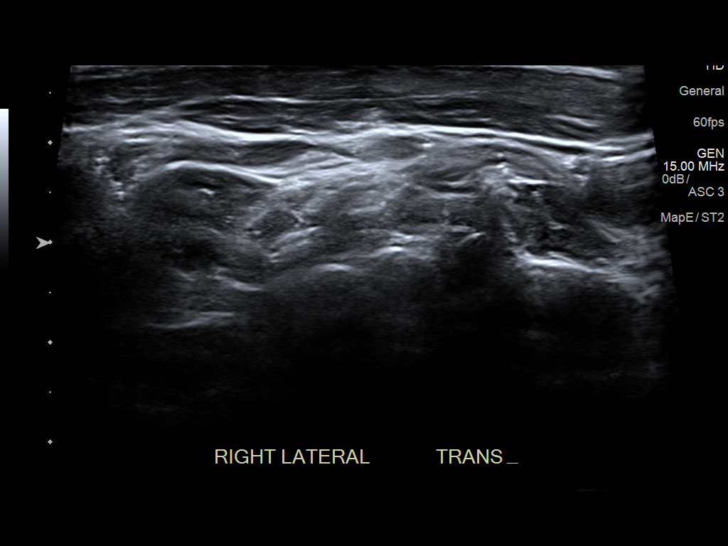

[14 of 20 positions shown; findings below may reference images not displayed]

FINDINGS: Sonographic interrogation of the palpable abnormality demonstrates
several prominent submandibular/submental lymph nodes measuring
and 0.7 cm in short axis. Sonographic interrogation of the
contralateral right submandibular space demonstrates a similar
appearing lymph node which is 1.0 cm in short axis.
IMPRESSION: Sonographic interrogation of the palpable abnormalities demonstrates
prominence submandibular/submental lymphadenopathy. Given the
patient's age, these are highly likely to be reactive in nature.
